# Patient Record
Sex: Male | Born: 2000 | Race: Black or African American | Hispanic: No | Marital: Single | Smoking: Never smoker
Health system: Southern US, Community
[De-identification: ages and names within clinical notes are randomized; demographics above are authoritative.]

---
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
## 2018-02-23 IMAGING — CR DG WRIST COMPLETE 3+V*R*
4 series · 4 of 4 positions shown · non-contrast
Comparison: None.

CLINICAL DATA: Pain after trauma.

EXAM:
RIGHT WRIST - COMPLETE 3+ VIEW

[x wrist pa right]
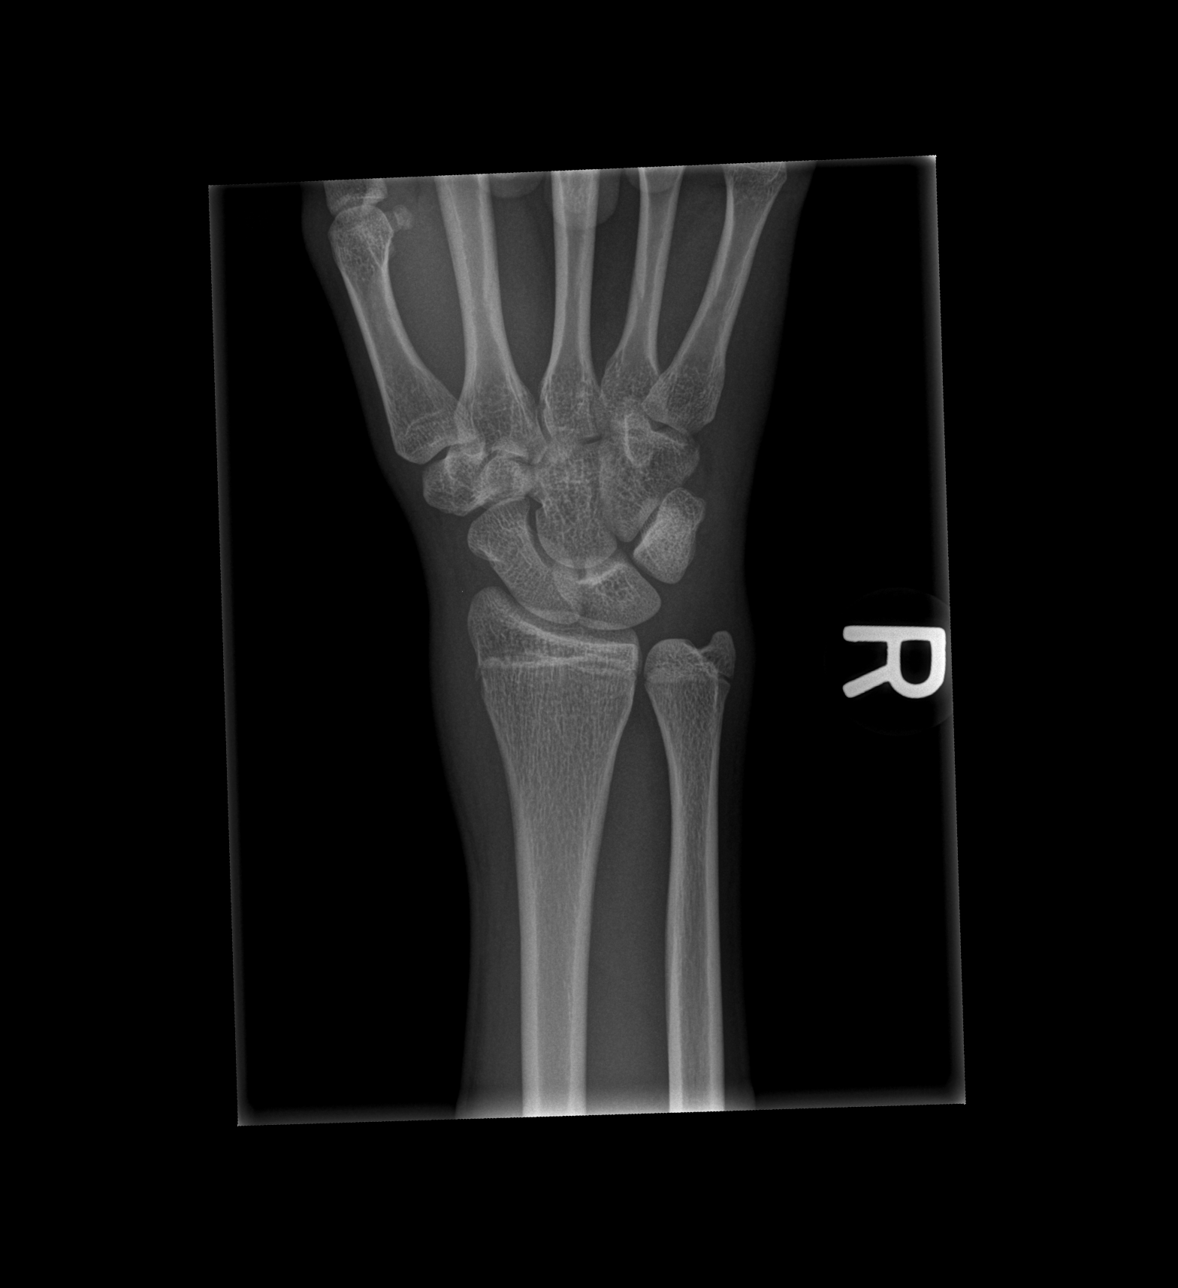

[x wrist obl right]
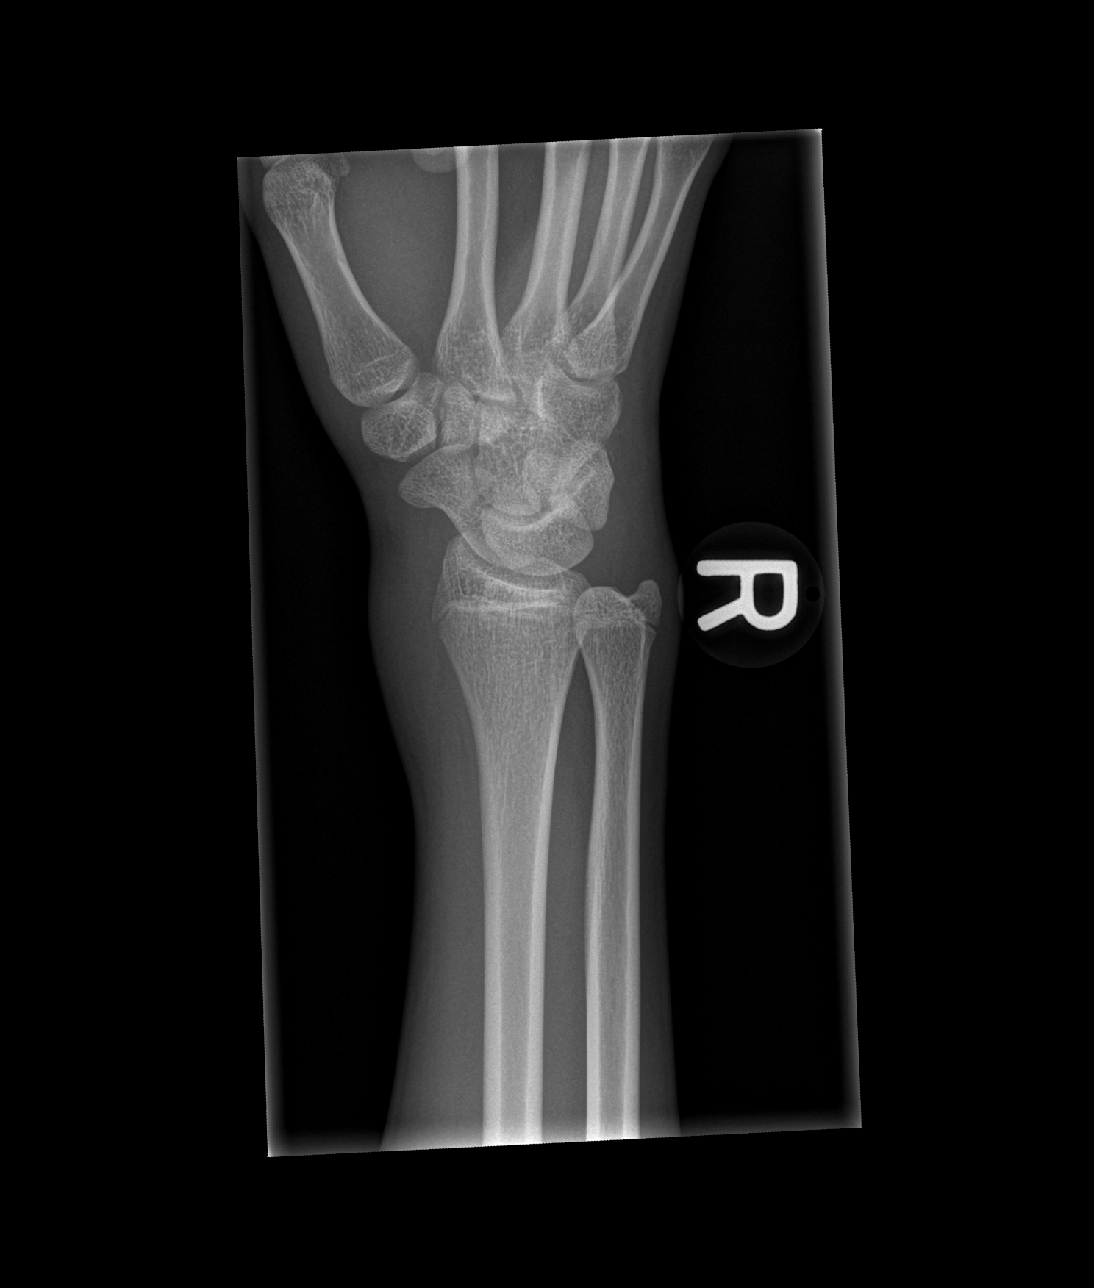

[x wrist lat right]
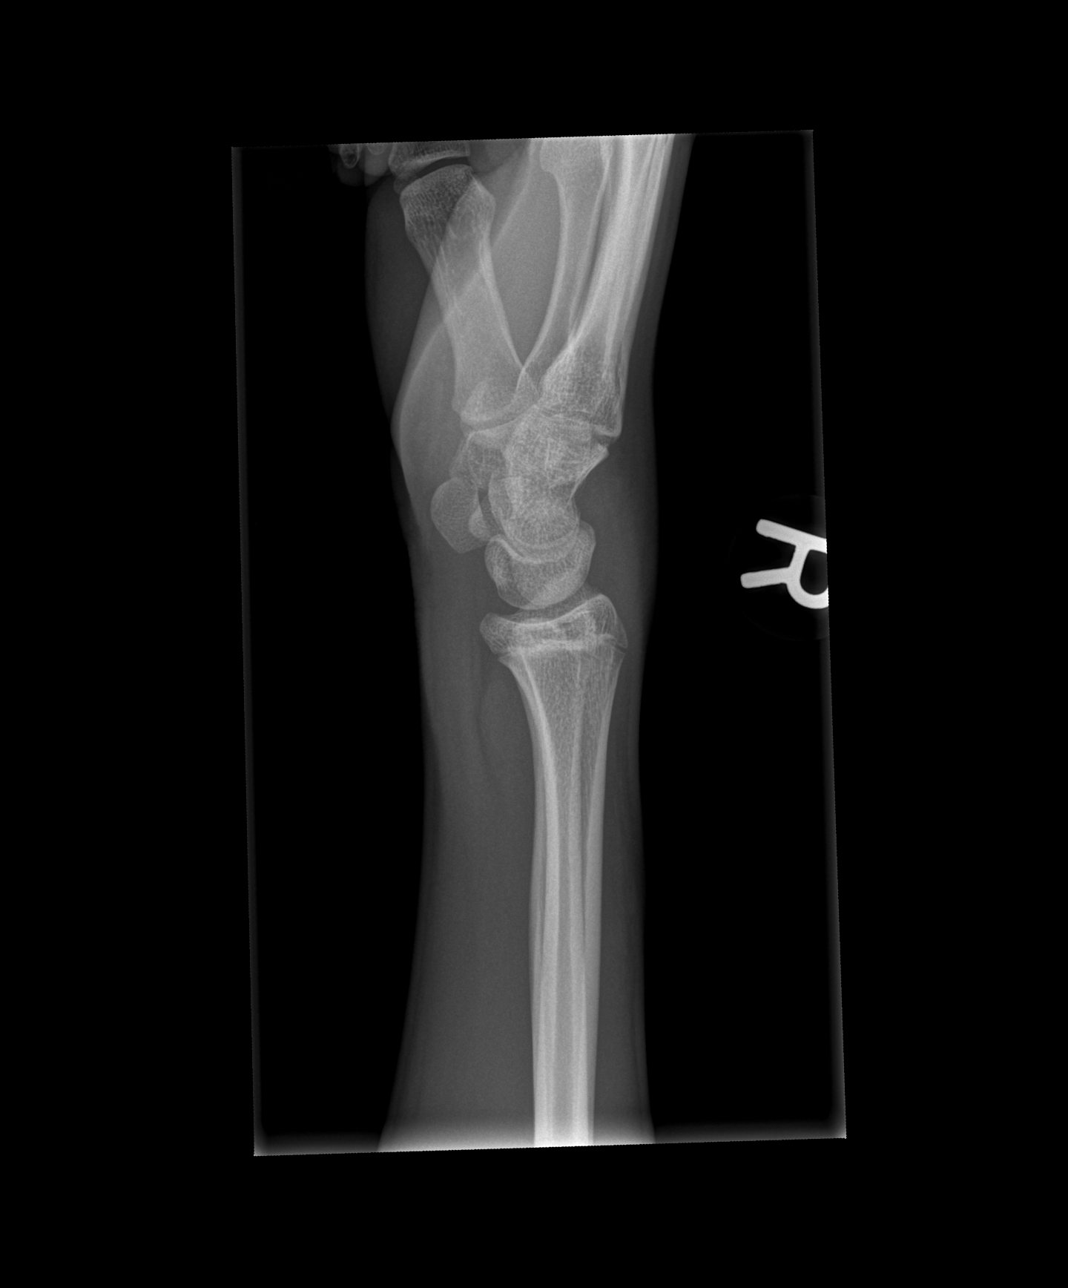

[x wrist navicular view right]
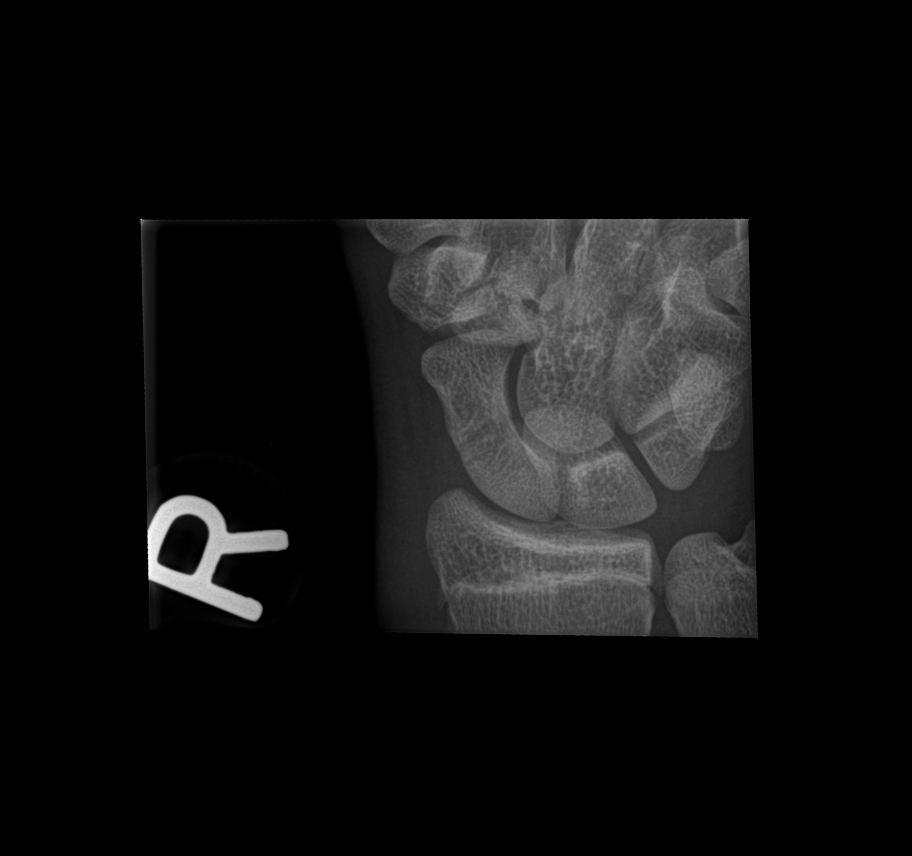

[4 of 4 positions shown; findings below may reference images not displayed]

FINDINGS: There is a significant soft tissue swelling centered over the distal
radius laterally. There is mild irregularity just proximal to the
radial physis on the AP view with possible minimal widening of the
physis on oblique views. No dislocation. The distal ulna is intact.
On most views, the scaphoid is normal in appearance. Lucency at
across the waist of the scaphoid on the dedicated view is favored to
represent prominent vascular channels rather than nondisplaced
fracture. Also, the soft tissue swelling appears to be more proximal
over the distal radius rather than adjacent to the scaphoid. There
is also a crescent of increased attenuation adjacent to the
triquetrum on the oblique view.
IMPRESSION: 1. Significant soft tissue swelling over the distal radius with mild
irregularity at the lateral aspect of the radial physis. A subtle
fracture is not excluded.
2. There is a crescent of increased attenuation adjacent to the
triquetrum on the oblique view. Again, a subtle fracture is not
excluded if the patient has pain in this region.
3. Lucency over the distal scaphoid is favored to represent a
vascular channel rather than a nondisplaced fracture. Recommend
attention on follow-up.
Recommend splinting the wrist with a follow up x-ray in 7-10 days
for further evaluation.
